# Patient Record
Sex: Female | Born: 1980 | Race: Black or African American | Hispanic: No | Marital: Single | Smoking: Former smoker
Health system: Southern US, Community
[De-identification: ages and names within clinical notes are randomized; demographics above are authoritative.]

## PROBLEM LIST (undated history)

## (undated) ENCOUNTER — Emergency Department (HOSPITAL_BASED_OUTPATIENT_CLINIC_OR_DEPARTMENT_OTHER): Admission: EM | Payer: Self-pay

---

## 2002-05-08 ENCOUNTER — Emergency Department (HOSPITAL_COMMUNITY): Admission: EM | Admit: 2002-05-08 | Discharge: 2002-05-08 | Payer: Self-pay | Admitting: Emergency Medicine

## 2003-11-26 ENCOUNTER — Emergency Department (HOSPITAL_COMMUNITY): Admission: EM | Admit: 2003-11-26 | Discharge: 2003-11-26 | Payer: Self-pay | Admitting: Emergency Medicine

## 2004-10-01 ENCOUNTER — Emergency Department (HOSPITAL_COMMUNITY): Admission: EM | Admit: 2004-10-01 | Discharge: 2004-10-02 | Payer: Self-pay | Admitting: Emergency Medicine

## 2005-05-13 ENCOUNTER — Emergency Department (HOSPITAL_COMMUNITY): Admission: EM | Admit: 2005-05-13 | Discharge: 2005-05-13 | Payer: Self-pay | Admitting: Emergency Medicine

## 2005-06-05 ENCOUNTER — Emergency Department (HOSPITAL_COMMUNITY): Admission: EM | Admit: 2005-06-05 | Discharge: 2005-06-05 | Payer: Self-pay | Admitting: *Deleted

## 2006-02-11 IMAGING — US US PELVIS COMPLETE MODIFY
1 series · 14 of 25 positions shown · non-contrast
Comparison: none

CLINICAL DATA: Vaginal bleeding; hx of abortion
 TRANSABDOMINAL AND TRANSVAGINAL PELVIC ULTRASOUND:
 The uterus measures 8.1 x 3.7 x 4.4 cm.  The myometrium appears normal.  The endometrium is 7 mm thick and normal in appearance.  No sign of retained products of conception.  Right ovary is normal at 2.5 x 1.7 x 2.2 cm.  Left ovary is normal at 2.6 x 1.8 x 2.4 cm.  There is no free fluid.

[Series 1: gyn · 0.23mm/px · 14 of 37 slices shown]
[im 1/37]
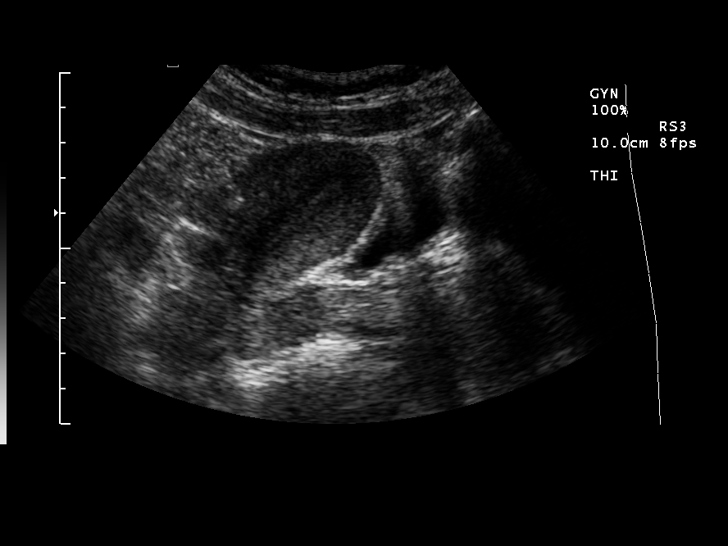
[im 4/37]
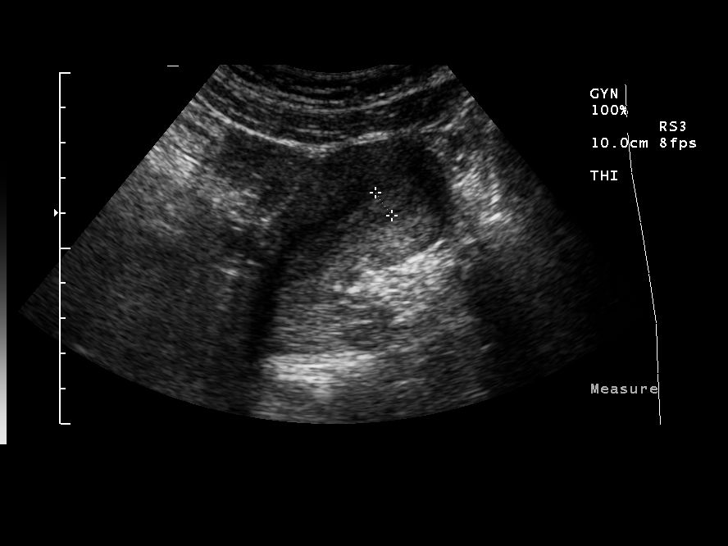
[im 7/37]
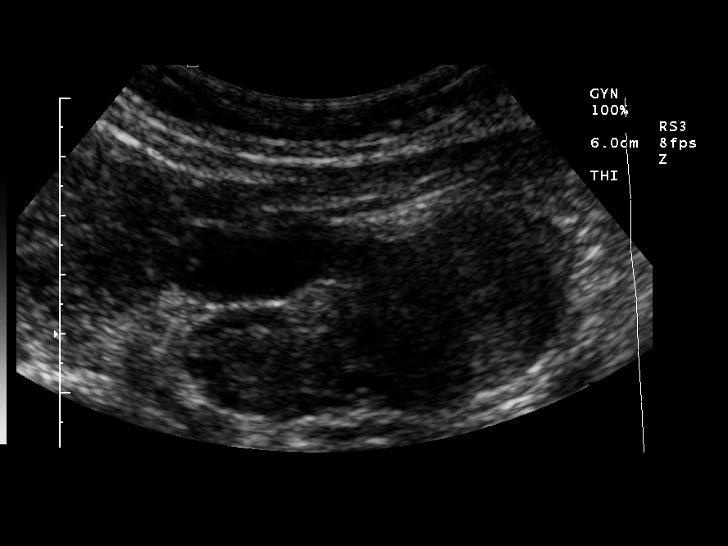
[im 10/37]
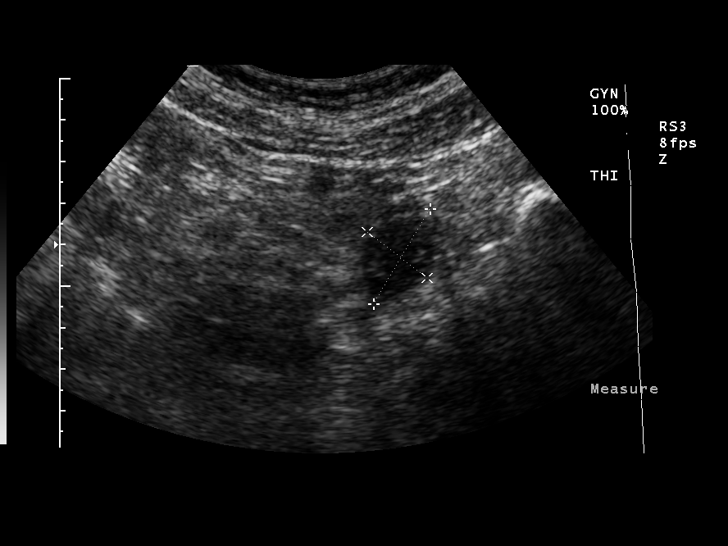
[im 13/37]
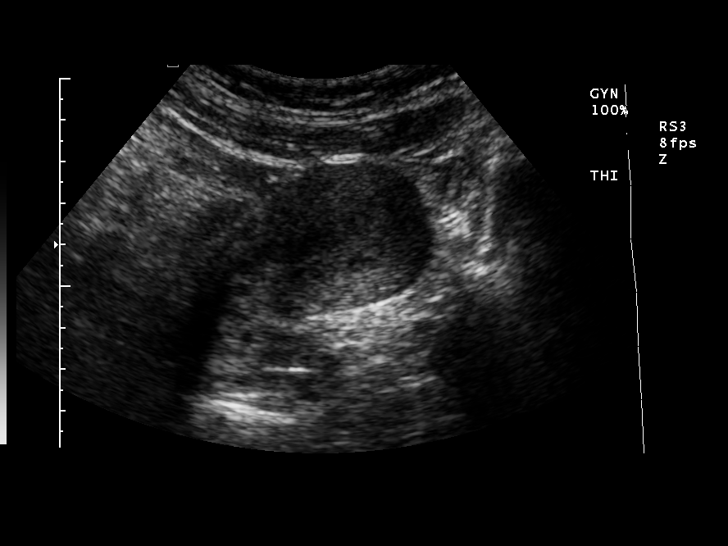
[im 14/37]
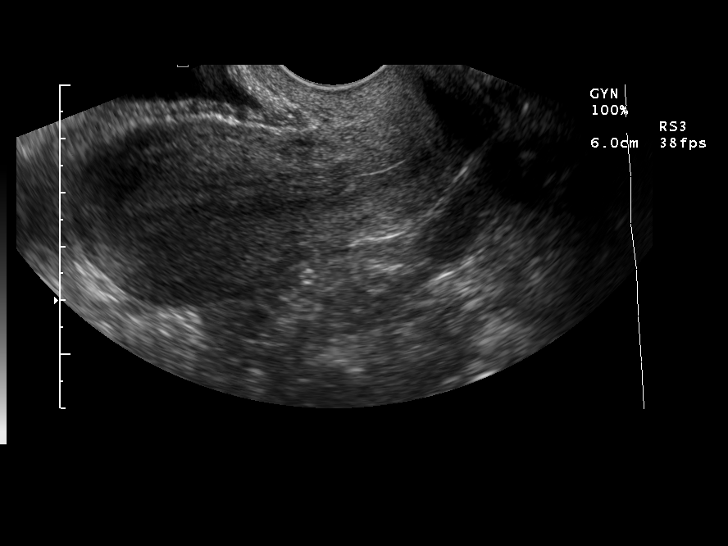
[im 17/37]
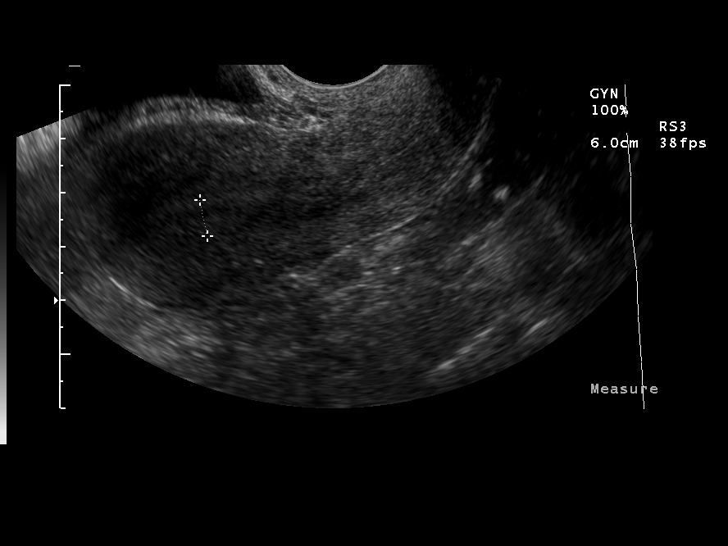
[im 20/37]
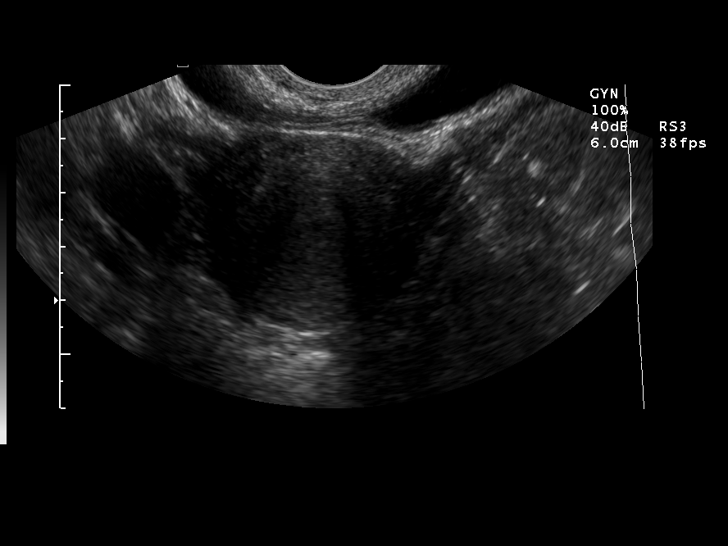
[im 23/37]
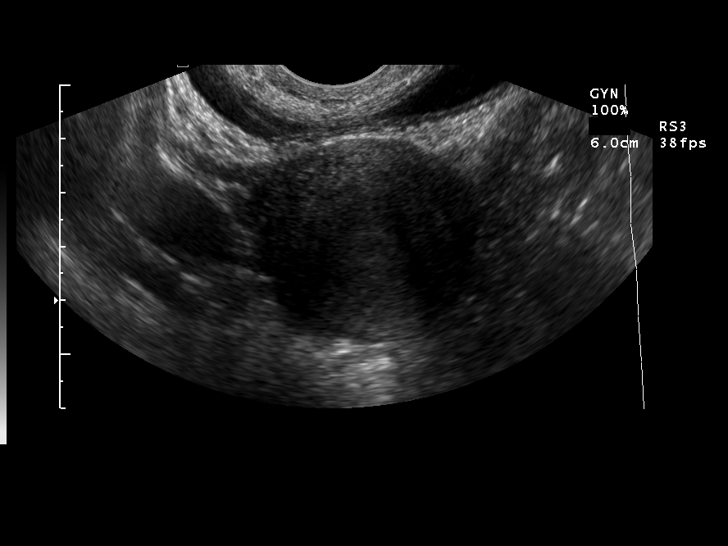
[im 25/37]
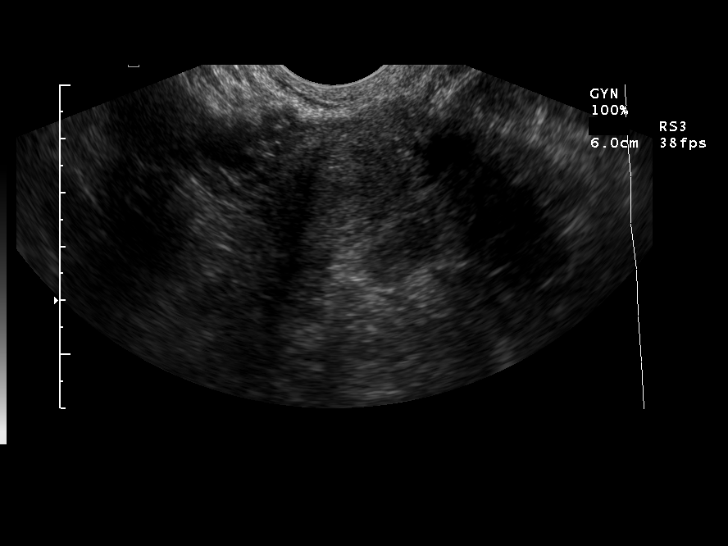
[im 28/37]
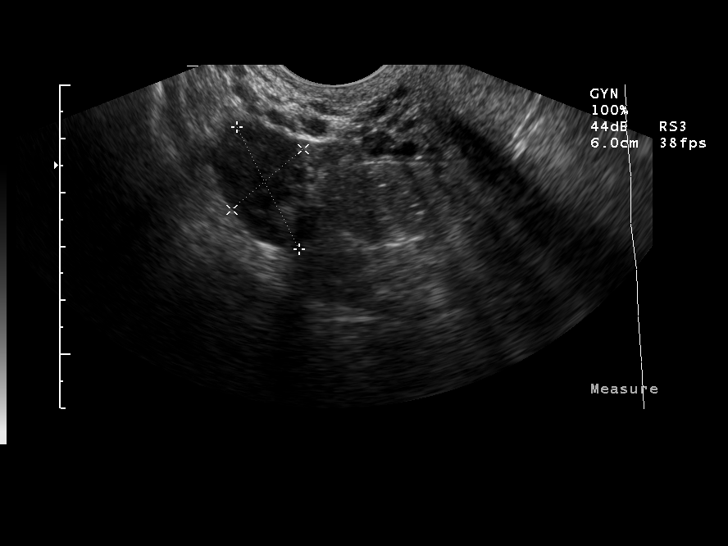
[im 31/37]
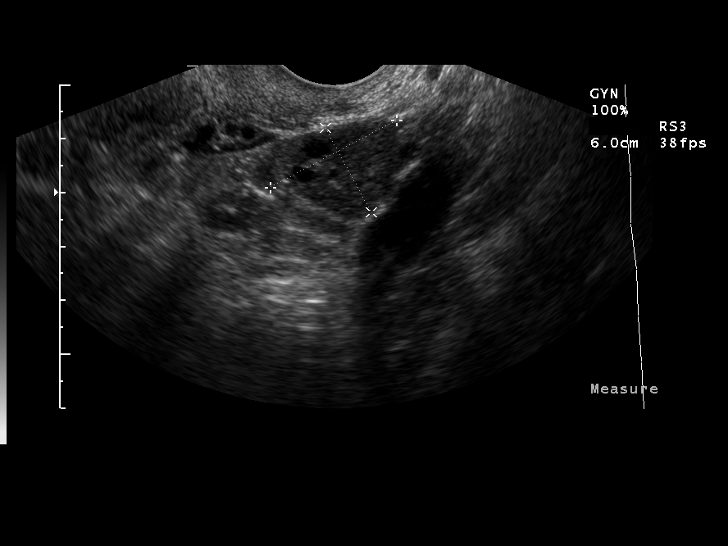
[im 34/37]
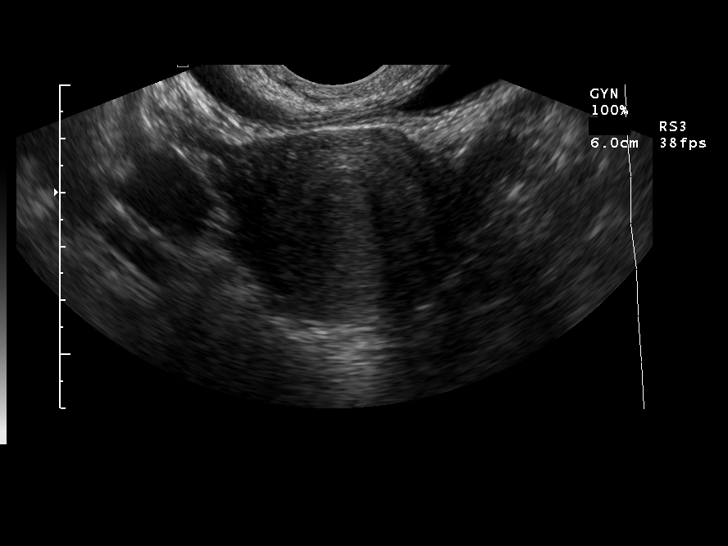
[im 37/37]
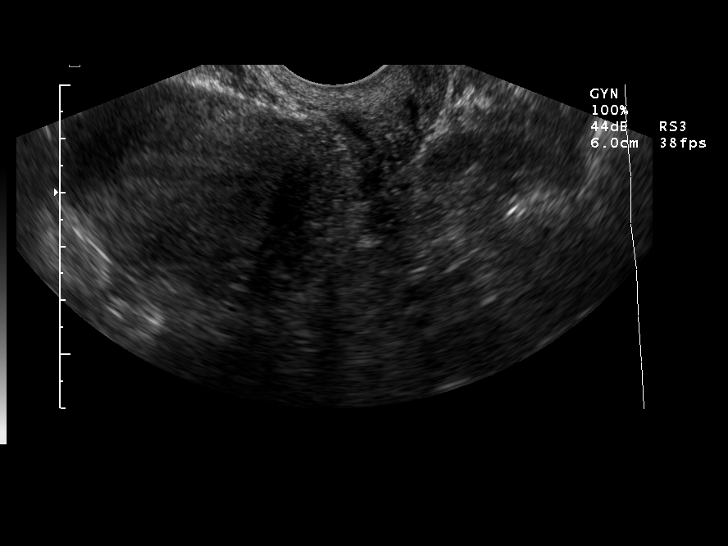

[14 of 25 positions shown; findings below may reference images not displayed]

IMPRESSION: Normal examination.

## 2006-10-16 IMAGING — US US OB COMP +14 WK
1 series · 14 of 28 positions shown · non-contrast
Comparison: none

CLINICAL DATA: 24-year-old pregnant female with abdominal and pelvic pain.

[Series 1: unknown · 0.30mm/px · 14 of 52 slices shown]
[im 2/52]
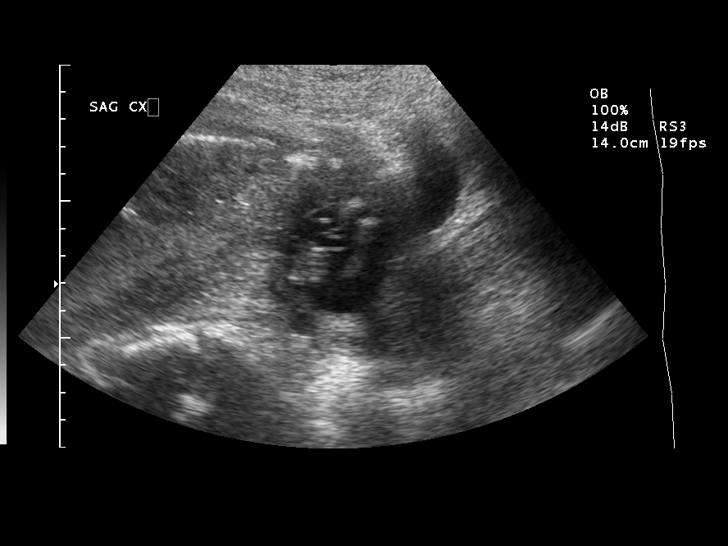
[im 6/52]
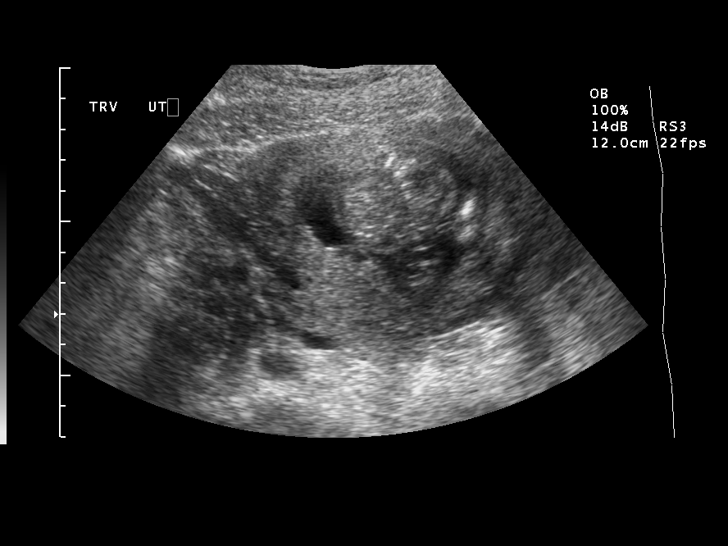
[im 10/52]
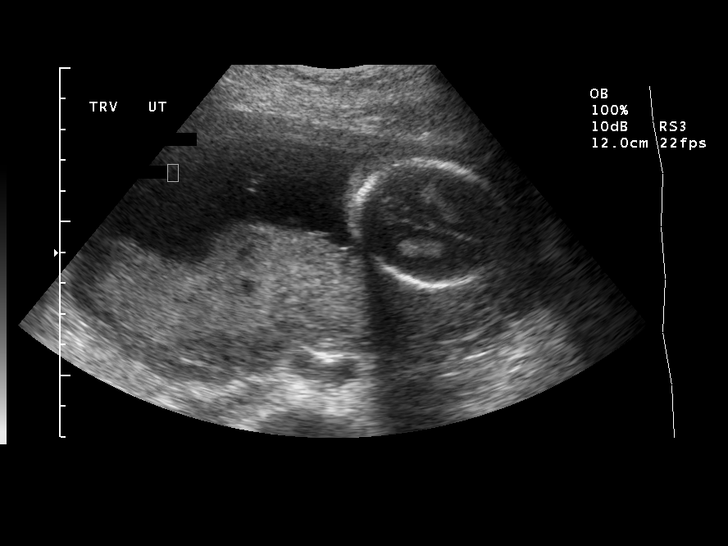
[im 14/52]
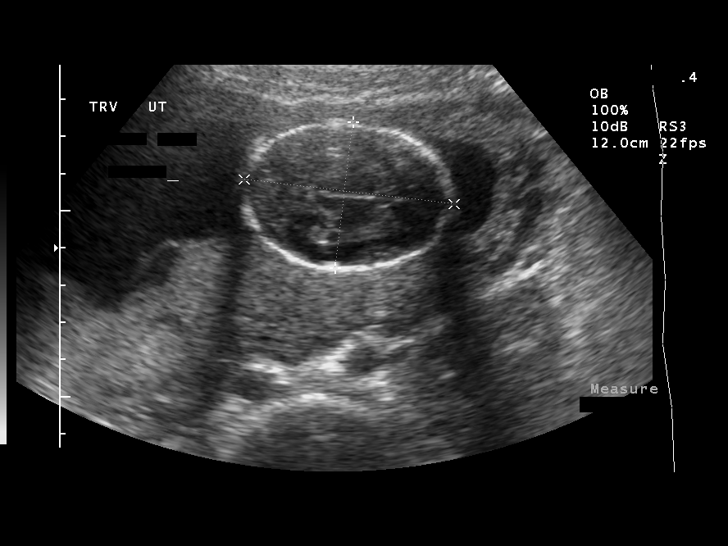
[im 18/52]
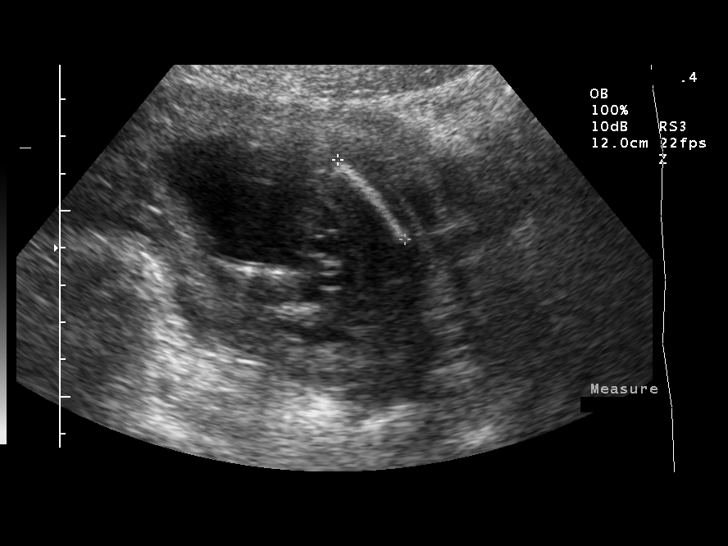
[im 21/52]
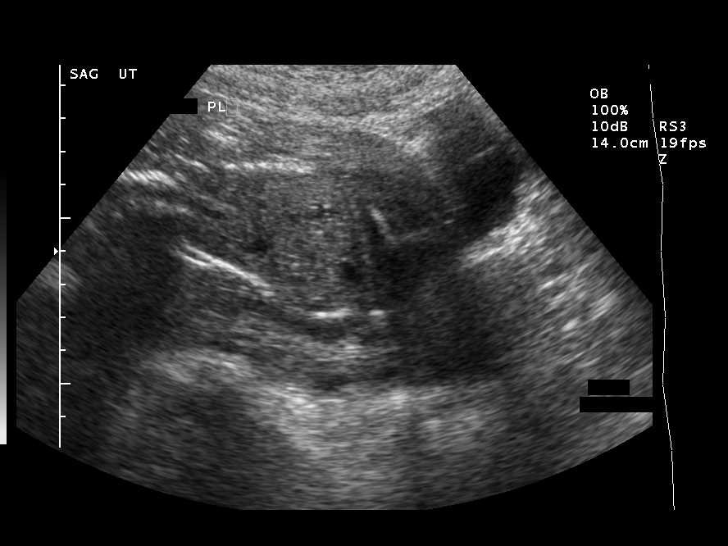
[im 25/52]
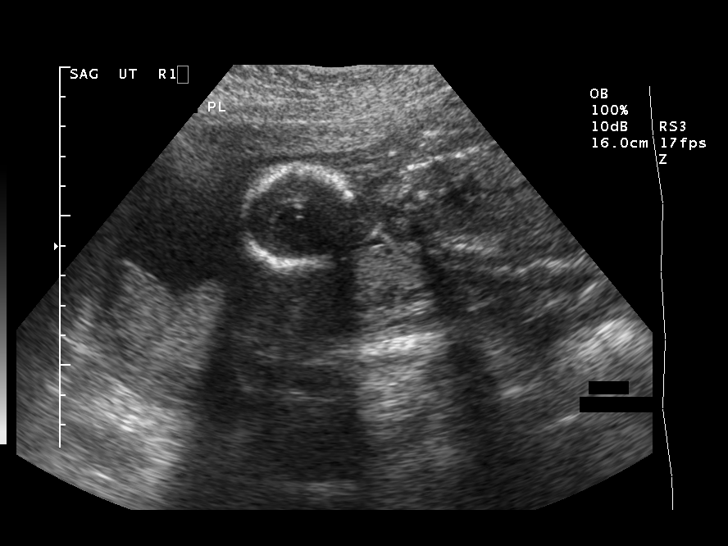
[im 29/52]
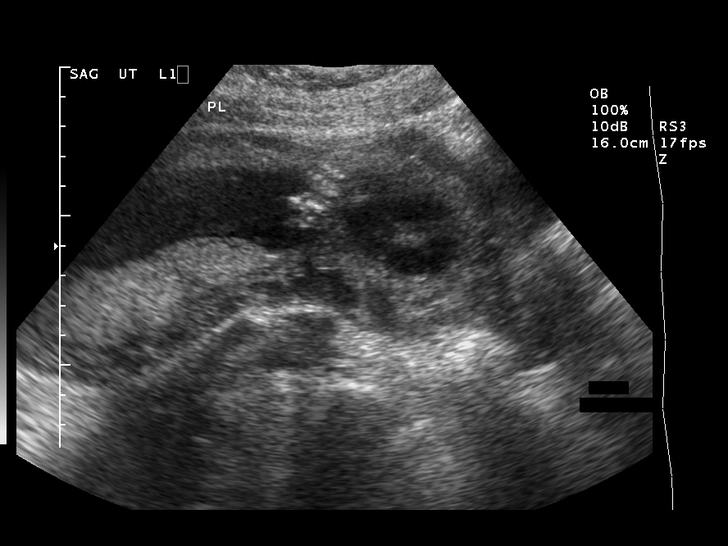
[im 33/52]
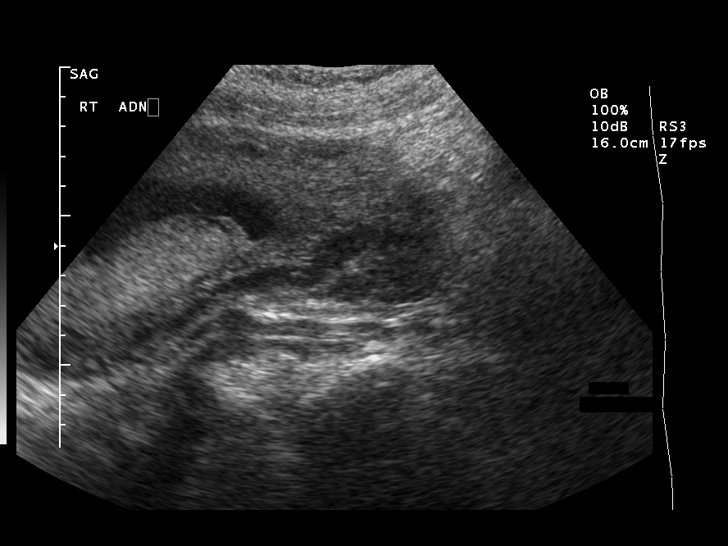
[im 36/52]
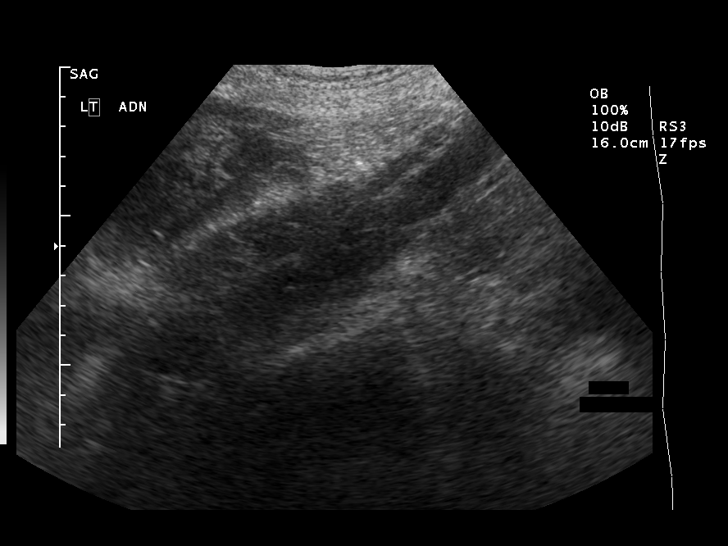
[im 40/52]
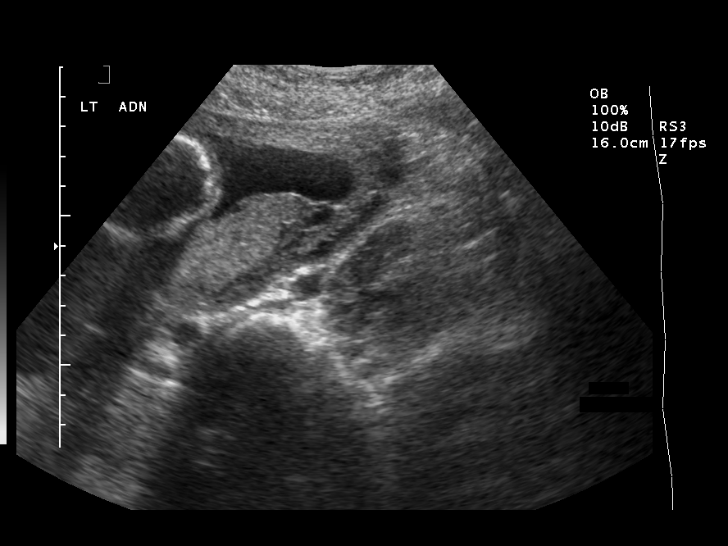
[im 44/52]
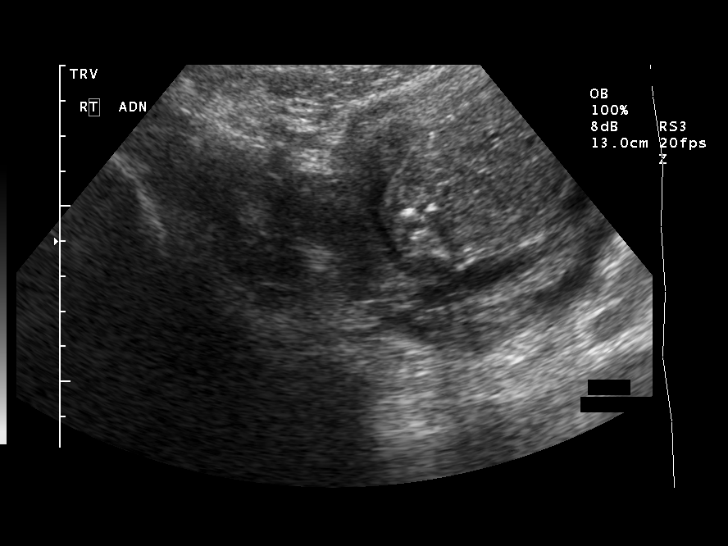
[im 48/52]
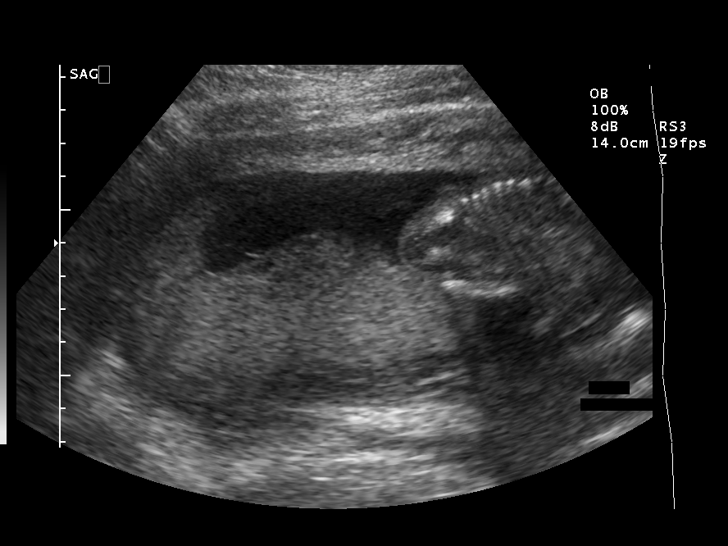
[im 52/52]
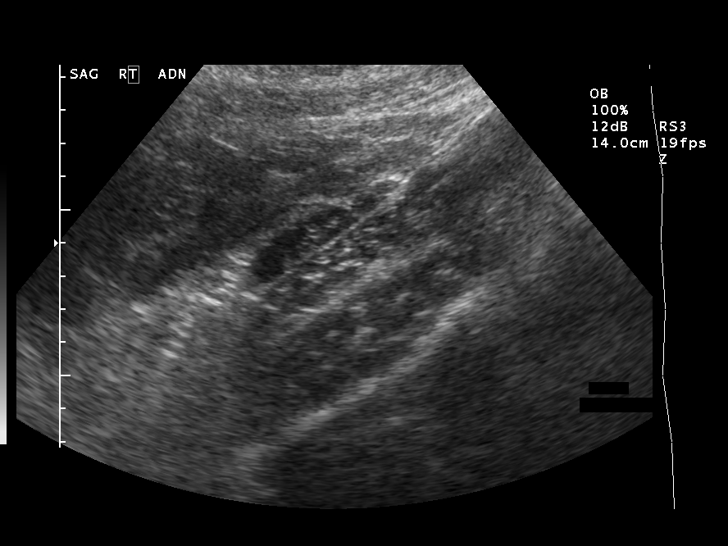

[14 of 28 positions shown; findings below may reference images not displayed]

OBSTETRICAL ULTRASOUND:
 Number of Fetuses:  1
 Heart Rate:  158
 Movement:  Yes
 Breathing:  No
 Presentation:  Breech
 Placental Location:  Posterior
 Grade:  0
 Previa:  No
 Amniotic Fluid (Subjective):  Normal

 FETAL BIOMETRY
 BPD:   4.0 cm  18 w 1 d
 HC:   15.9 cm  18 w 5 d
 AC:   12.4 cm  18 w 1 d
 FL:    2.8 cm  18 w 4 d

 MEAN GA:  18 w 3 d  US EDC:  11/03/05
 Assessment of fetal anatomy was not performed on this exam.
 MATERNAL UTERINE AND ADNEXAL FINDINGS
 Cervix:   Not recorded.
IMPRESSION: Single living intrauterine fetus estimated at 18 weeks and 3 days with normal amniotic fluid volume and normal placenta.

## 2011-07-31 ENCOUNTER — Emergency Department (HOSPITAL_COMMUNITY)
Admission: EM | Admit: 2011-07-31 | Discharge: 2011-07-31 | Disposition: A | Discharge status: Home / Self Care | Payer: Self-pay | Attending: Emergency Medicine | Admitting: Emergency Medicine

## 2011-07-31 ENCOUNTER — Emergency Department (HOSPITAL_COMMUNITY): Payer: Self-pay

## 2011-07-31 DIAGNOSIS — N949 Unspecified condition associated with female genital organs and menstrual cycle: Secondary | ICD-10-CM | POA: Insufficient documentation

## 2011-07-31 DIAGNOSIS — M545 Low back pain, unspecified: Secondary | ICD-10-CM | POA: Insufficient documentation

## 2011-07-31 DIAGNOSIS — N898 Other specified noninflammatory disorders of vagina: Secondary | ICD-10-CM | POA: Insufficient documentation

## 2011-07-31 DIAGNOSIS — N939 Abnormal uterine and vaginal bleeding, unspecified: Secondary | ICD-10-CM

## 2011-07-31 DIAGNOSIS — R109 Unspecified abdominal pain: Secondary | ICD-10-CM | POA: Insufficient documentation

## 2011-07-31 LAB — CBC
MCHC: 32.4 g/dL (ref 30.0–36.0)
RBC: 4.75 MIL/uL (ref 3.87–5.11)

## 2011-07-31 NOTE — ED Notes (Addendum)
Patient states she started spotting on Friday and this morning bleeding worse and cramps. Patient states she has passed clots this morning. Patietn state this was her second pregnancy and she has a 32 years old daughter. Family at bedside.

## 2011-07-31 NOTE — Discharge Instructions (Signed)
Your pregnancy test is negative and ultrasound appears unremarkable. Take tylenol/advil as need. Follow up with your doctor/gynecologist in 1 week if symptoms fail to improve/resolve. Go to [facility] if worse, severe bleeding, severe pain, fevers, other concern.       Dysfunctional Uterine Bleeding Normally, menstrual periods begin between ages 46 to 82 in young women. A normal menstrual cycle/period may begin every 23 days up to 35 days and lasts from 1 to 7 days. Around 12 to 14 days before your menstrual period starts, ovulation (ovary produces an egg) occurs. When counting the time between menstrual periods, count from the first day of bleeding of the previous period to the first day of bleeding of the next period. Dysfunctional (abnormal) uterine bleeding is bleeding that is different from a normal menstrual period. Your periods may come earlier or later than usual. They may be lighter, have blood clots or be heavier. You may have bleeding between periods, or you may skip one period or more. You may have bleeding after sexual intercourse, bleeding after menopause, or no menstrual period. CAUSES   Pregnancy (normal, miscarriage, tubal).   IUDs (intrauterine device, birth control).   Birth control pills.   Hormone treatment.   Menopause.   Infection of the cervix.   Blood clotting problems.   Infection of the inside lining of the uterus.   Endometriosis, inside lining of the uterus growing in the pelvis and other female organs.   Adhesions (scar tissue) inside the uterus.   Obesity or severe weight loss.   Uterine polyps inside the uterus.   Cancer of the vagina, cervix, or uterus.   Ovarian cysts or polycystic ovary syndrome.   Medical problems (diabetes, thyroid disease).   Uterine fibroids (noncancerous tumor).   Problems with your female hormones.   Endometrial hyperplasia, very thick lining and enlarged cells inside of the uterus.   Medicines that  interfere with ovulation.   Radiation to the pelvis or abdomen.   Chemotherapy.  DIAGNOSIS   Your doctor will discuss the history of your menstrual periods, medicines you are taking, changes in your weight, stress in your life, and any medical problems you may have.   Your doctor will do a physical and pelvic examination.   Your doctor may want to perform certain tests to make a diagnosis, such as:   Pap test.   Blood tests.   Cultures for infection.   CT scan.   Ultrasound.   Hysteroscopy.   Laparoscopy.   MRI.   Hysterosalpingography.   D and C.   Endometrial biopsy.  TREATMENT  Treatment will depend on the cause of the dysfunctional uterine bleeding (DUB). Treatment may include:  Observing your menstrual periods for a couple of months.   Prescribing medicines for medical problems, including:   Antibiotics.   Hormones.   Birth control pills.   Removing an IUD (intrauterine device, birth control).   Surgery:   D and C (scrape and remove tissue from inside the uterus).   Laparoscopy (examine inside the abdomen with a lighted tube).   Uterine ablation (destroy lining of the uterus with electrical current, laser, heat, or freezing).   Hysteroscopy (examine cervix and uterus with a lighted tube).   Hysterectomy (remove the uterus).  HOME CARE INSTRUCTIONS   If medicines were prescribed, take exactly as directed. Do not change or switch medicines without consulting your caregiver.   Long term heavy bleeding may result in iron deficiency. Your caregiver may have prescribed iron pills. They  help replace the iron that your body lost from heavy bleeding. Take exactly as directed.   Do not take aspirin or medicines that contain aspirin one week before or during your menstrual period. Aspirin may make the bleeding worse.   If you need to change your sanitary pad or tampon more than once every 2 hours, stay in bed with your feet elevated and a cold pack on  your lower abdomen. Rest as much as possible, until the bleeding stops or slows down.   Eat well-balanced meals. Eat foods high in iron. Examples are:   Leafy green vegetables.   Whole-grain breads and cereals.   Eggs.   Meat.   Liver.   Do not try to lose weight until the abnormal bleeding has stopped and your blood iron level is back to normal. Do not lift more than ten pounds or do strenuous activities when you are bleeding.   For a couple of months, make note on your calendar, marking the start and ending of your period, and the type of bleeding (light, medium, heavy, spotting, clots or missed periods). This is for your caregiver to better evaluate your problem.  SEEK MEDICAL CARE IF:   You develop nausea (feeling sick to your stomach) and vomiting, dizziness, or diarrhea while you are taking your medicine.   You are getting lightheaded or weak.   You have any problems that may be related to the medicine you are taking.   You develop pain with your DUB.   You want to remove your IUD.   You want to stop or change your birth control pills or hormones.   You have any type of abnormal bleeding mentioned above.   You are over 17 years old and have not had a menstrual period yet.   You are 30 years old and you are still having menstrual periods.   You have any of the symptoms mentioned above.   You develop a rash.  SEEK IMMEDIATE MEDICAL CARE IF:   An oral temperature above 102 F (38.9 C) develops.   You develop chills.   You are changing your sanitary pad or tampon more than once an hour.   You develop abdominal pain.   You pass out or faint.  Document Released: 04/21/2000 Document Revised: 04/13/2011 Document Reviewed: 03/23/2009 [facility] Patient Information 2012 [place], [place].

## 2011-07-31 NOTE — ED Notes (Signed)
Patient being transported to [place] and will be transfer to CDU 3 on return to ED. Report was given to [place], Charity fundraiser.

## 2011-07-31 NOTE — ED Notes (Signed)
Patient resting with husband at bedside. Patient crying and is visible upset. Waiting test results.

## 2011-07-31 NOTE — ED Provider Notes (Signed)
History     CSN: [id number]  Arrival date & time 07/31/11  [id number]   First MD Initiated Contact with Patient 07/31/11 [phone]      Chief Complaint  Patient presents with  . Miscarriage    (Consider location/radiation/quality/duration/timing/severity/associated sxs/prior treatment) The history is provided by the patient.  pt states possible miscarriage. lnmp end of January 2013. In past 4-5 days has had some 'spotting', dark blood. In past 1-2 days, heavier vaginal bleeding c/w period, dark blood, clots. Intermittent lower abd and low back cramping c/w menstrual cramps. No constant, focal abdominal pain. No vaginal discharge. No dysuria or urgency.  No fever or chills. No faintness or dizziness.  [id number], has 32 year old and 1 prior miscarriage.   No past medical history on file.  No past surgical history on file.  No family history on file.  History  Substance Use Topics  . Smoking status: Not on file  . Smokeless tobacco: Not on file  . Alcohol Use: Not on file    OB History    No data available      Review of Systems  Constitutional: Negative for fever and chills.  HENT: Negative for neck pain.   Respiratory: Negative for shortness of breath.   Cardiovascular: Negative for chest pain.  Gastrointestinal: Negative for abdominal pain.  Genitourinary: Negative for flank pain.  Musculoskeletal: Negative for back pain.  Skin: Negative for rash.  Neurological: Negative for headaches.  Hematological: Does not bruise/bleed easily.  Psychiatric/Behavioral: Negative for confusion.    Allergies  Aspirin  Home Medications   Current Outpatient Rx  Name Route Sig Dispense Refill  . ACETAMINOPHEN 500 MG PO TABS Oral Take 1,000 mg by mouth once.      BP 128/100  Pulse 83  Temp(Src) 98.2 F (36.8 C) (Oral)  SpO2 98%  Physical Exam  Nursing note and vitals reviewed. Constitutional: She appears well-developed and well-nourished. No distress.  Eyes: Conjunctivae are normal. No  scleral icterus.  Neck: Neck supple. No tracheal deviation present.  Cardiovascular: Normal rate, regular rhythm, normal heart sounds and intact distal pulses.   Pulmonary/Chest: Effort normal and breath sounds normal. No respiratory distress.  Abdominal: Soft. Normal appearance and bowel sounds are normal. She exhibits no distension. There is no tenderness.  Genitourinary:       No cva tenderness. Pelvic, ext exam normal. Pt w small amt dark blood in vagina. No active bleeding from cervix. Cervix closed. No cmt. No adx masses/ tenderness.   Musculoskeletal: She exhibits no edema.  Neurological: She is alert.  Skin: Skin is warm and dry. No rash noted.  Psychiatric: She has a normal mood and affect.    ED Course  Procedures (including critical care time)   Results for orders placed during the hospital encounter of 07/31/11  CBC      Component Value Range   WBC 6.4  4.0 - 10.5 (K/uL)   RBC 4.75  3.87 - 5.11 (MIL/uL)   Hemoglobin 10.8 (*) 12.0 - 15.0 (g/dL)   HCT 29.5 (*) 28.4 - 46.0 (%)   MCV 70.1 (*) 78.0 - 100.0 (fL)   MCH 22.7 (*) 26.0 - 34.0 (pg)   MCHC 32.4  30.0 - 36.0 (g/dL)   RDW 13.2  44.0 - 10.2 (%)   Platelets 293  150 - 400 (K/uL)  HCG, QUANTITATIVE, PREGNANCY      Component Value Range   hCG, Beta Chain, Quant, S <1  <5 (mIU/mL)  ABO/RH  Component Value Range   ABO/RH(D) O POS     No rh immune globuloin NOT A RH IMMUNE GLOBULIN CANDIDATE, PT RH POSITIVE     US Transvaginal Non-ob  07/31/2011  *RADIOLOGY REPORT*  Clinical Data: Pelvic pain and vaginal bleeding.  TRANSABDOMINAL AND TRANSVAGINAL ULTRASOUND OF PELVIS Technique:  Both transabdominal and transvaginal ultrasound examinations of the pelvis were performed. Transabdominal technique was performed for global imaging of the pelvis including uterus, ovaries, adnexal regions, and pelvic cul-de-sac.  Comparison: None.   It was necessary to proceed with endovaginal exam following the transabdominal exam to  visualize the ovaries and endometrium.  Findings:  Uterus: Measures 7.7 x 5.4 x 4.4 cm.  Retroverted position.  No myometrial abnormalities are demonstrated.  Endometrium: Normal in thickness measuring 2 mm.  Right ovary:  Measures 2.5 x 1.6 x 1.6 cm.  No cysts or masses.  Left ovary: Measures 2.6 x 2.4 x 2.0 cm.  No cysts or masses.  Other findings: Trace free pelvic fluid.  IMPRESSION: Normal study. No evidence of pelvic mass or other significant abnormality.  Original Report Authenticated By: P. Loralie Champagne, M.D.   US Pelvis Complete  07/31/2011  *RADIOLOGY REPORT*  Clinical Data: Pelvic pain and vaginal bleeding.  TRANSABDOMINAL AND TRANSVAGINAL ULTRASOUND OF PELVIS Technique:  Both transabdominal and transvaginal ultrasound examinations of the pelvis were performed. Transabdominal technique was performed for global imaging of the pelvis including uterus, ovaries, adnexal regions, and pelvic cul-de-sac.  Comparison: None.   It was necessary to proceed with endovaginal exam following the transabdominal exam to visualize the ovaries and endometrium.  Findings:  Uterus: Measures 7.7 x 5.4 x 4.4 cm.  Retroverted position.  No myometrial abnormalities are demonstrated.  Endometrium: Normal in thickness measuring 2 mm.  Right ovary:  Measures 2.5 x 1.6 x 1.6 cm.  No cysts or masses.  Left ovary: Measures 2.6 x 2.4 x 2.0 cm.  No cysts or masses.  Other findings: Trace free pelvic fluid.  IMPRESSION: Normal study. No evidence of pelvic mass or other significant abnormality.  Original Report Authenticated By: P. Loralie Champagne, M.D.        MDM  Labs and ultrasound ordered.   Recheck abd soft nt.    U/s neg. preg neg. abd soft nt.       Suzi Roots, MD 07/31/11 1135

## 2011-07-31 NOTE — ED Notes (Signed)
Miscarriage

## 2012-12-10 IMAGING — US US PELVIS COMPLETE
1 series · 14 of 25 positions shown · non-contrast
Comparison: None.

CLINICAL DATA: Pelvic pain and vaginal bleeding.

TRANSABDOMINAL AND TRANSVAGINAL ULTRASOUND OF PELVIS
TECHNIQUE: Both transabdominal and transvaginal ultrasound
examinations of the pelvis were performed. Transabdominal technique
was performed for global imaging of the pelvis including uterus,
ovaries, adnexal regions, and pelvic cul-de-sac.

[Series 1: us pelvis complete · 0.28mm/px · 14 of 52 slices shown]
[im 1/52]
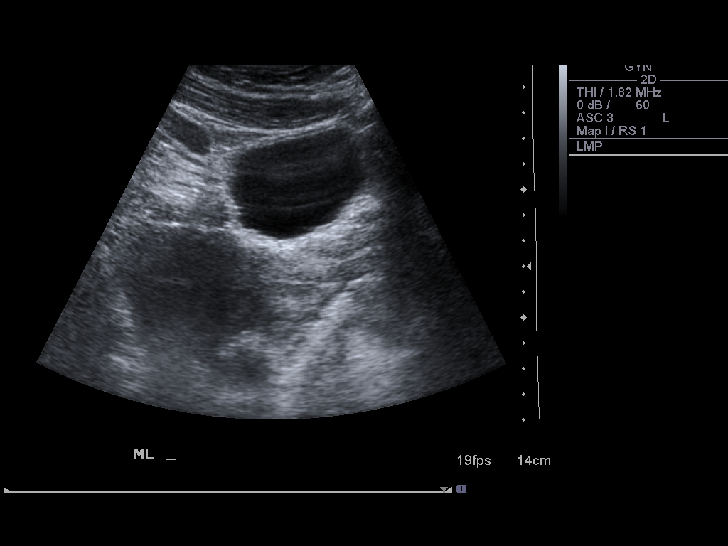
[im 5/52]
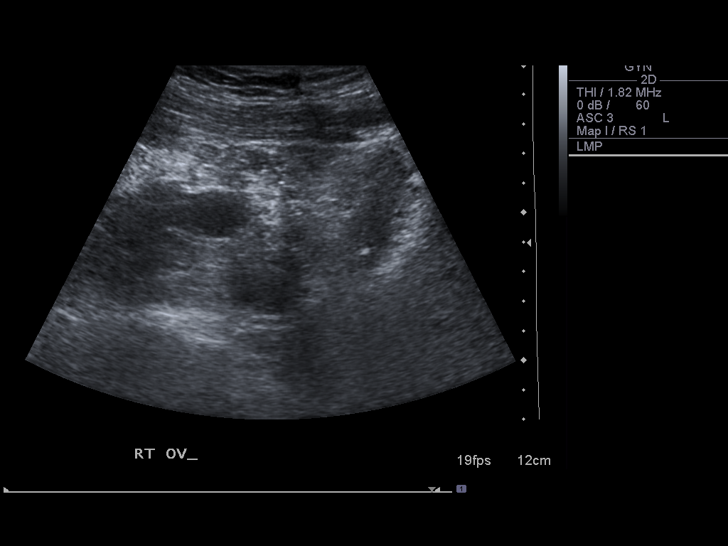
[im 9/52]
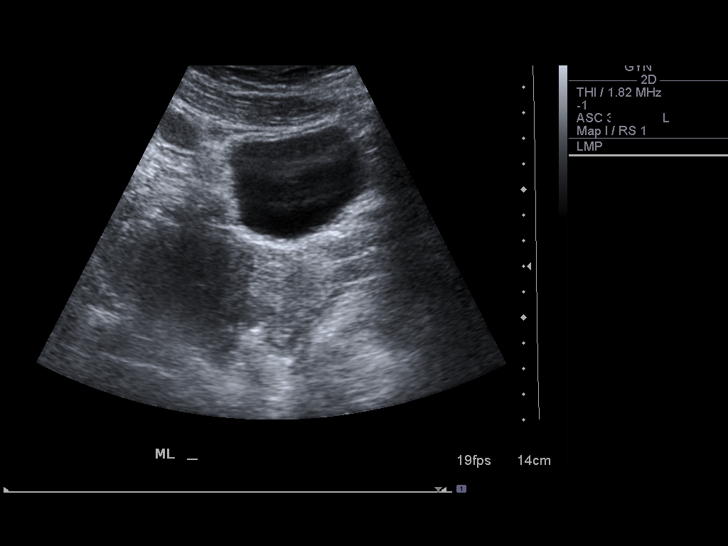
[im 13/52]
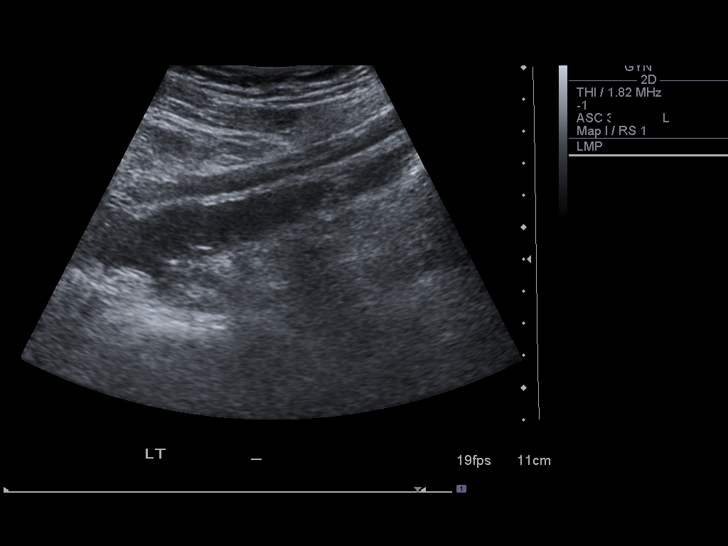
[im 18/52]
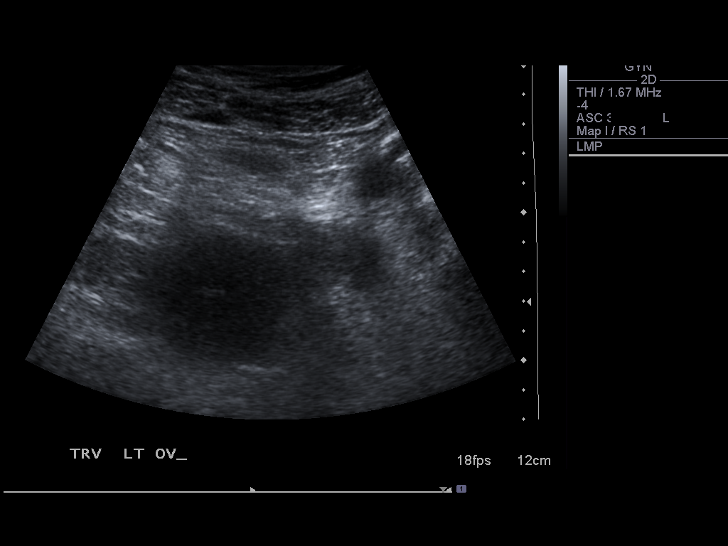
[im 20/52]
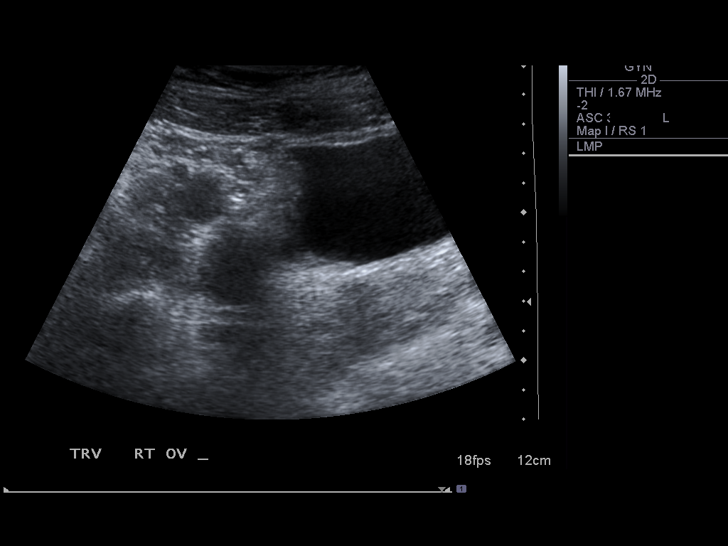
[im 24/52]
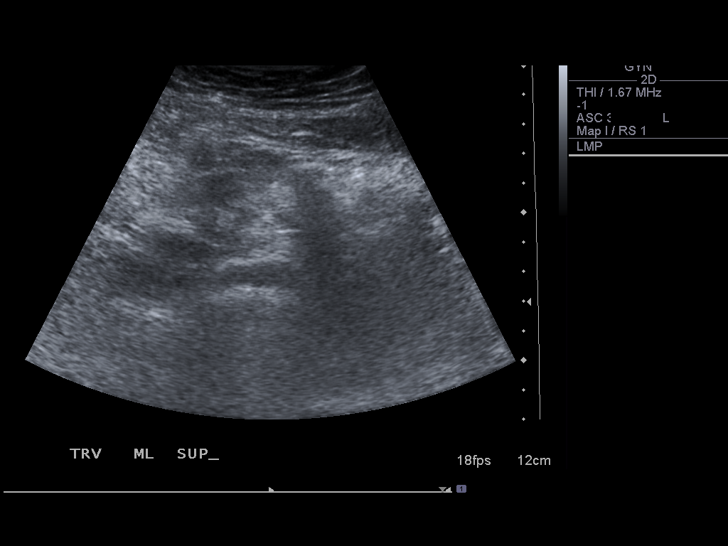
[im 28/52]
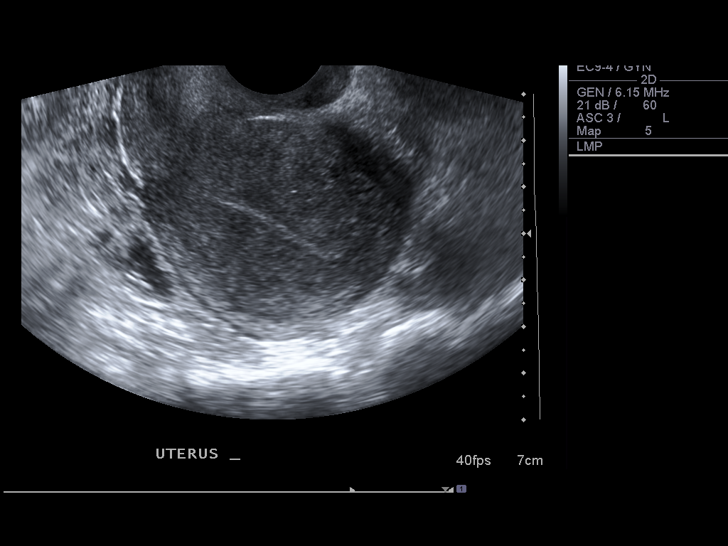
[im 32/52]
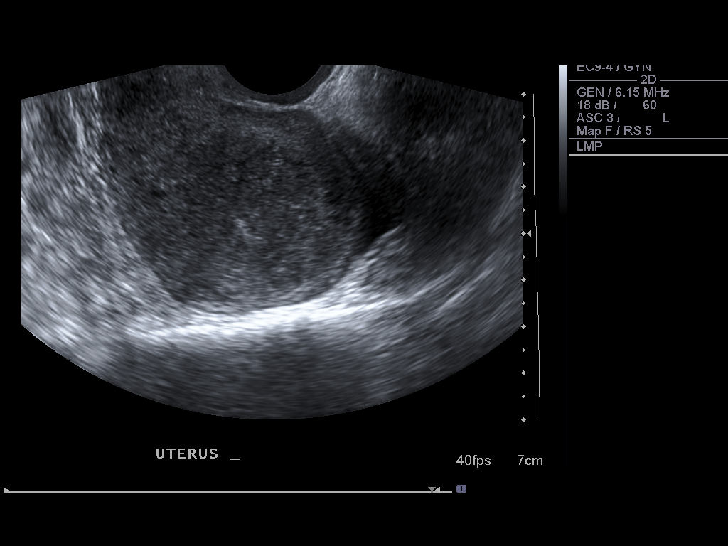
[im 35/52]
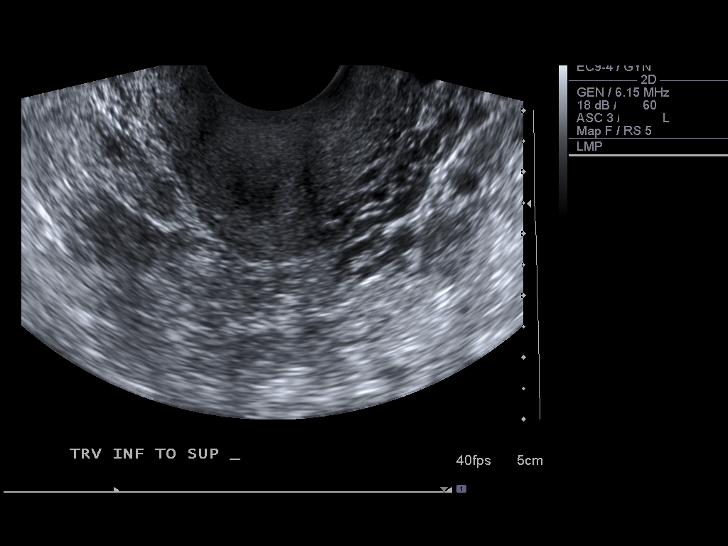
[im 39/52]
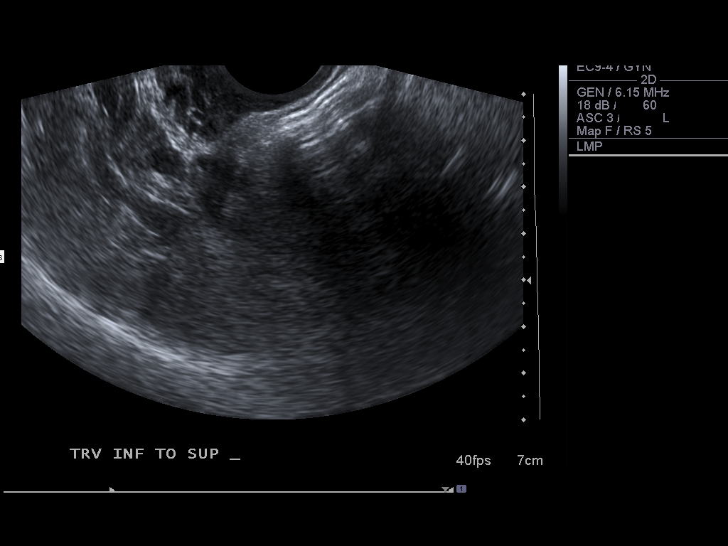
[im 43/52]
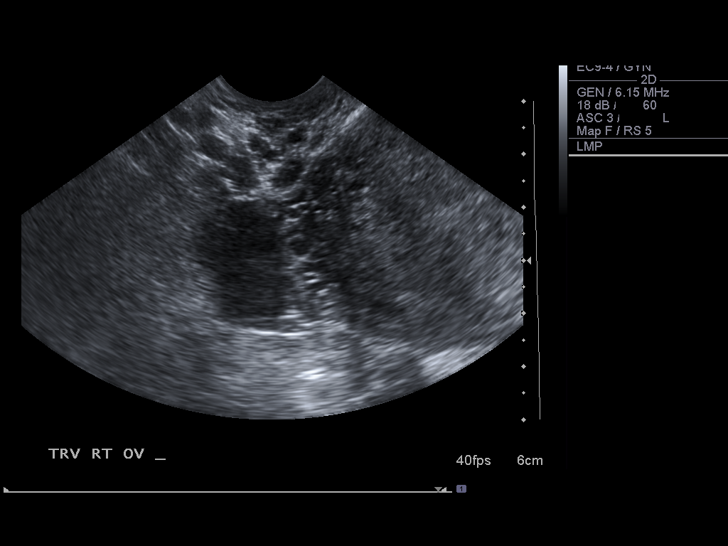
[im 47/52]
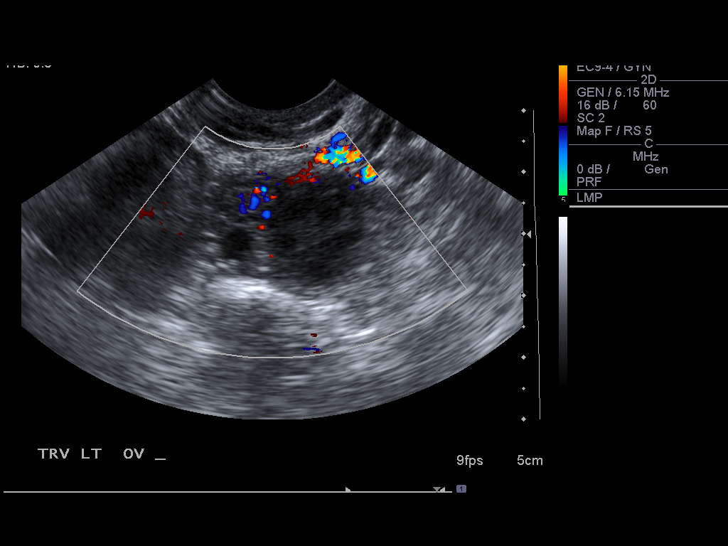
[im 52/52]
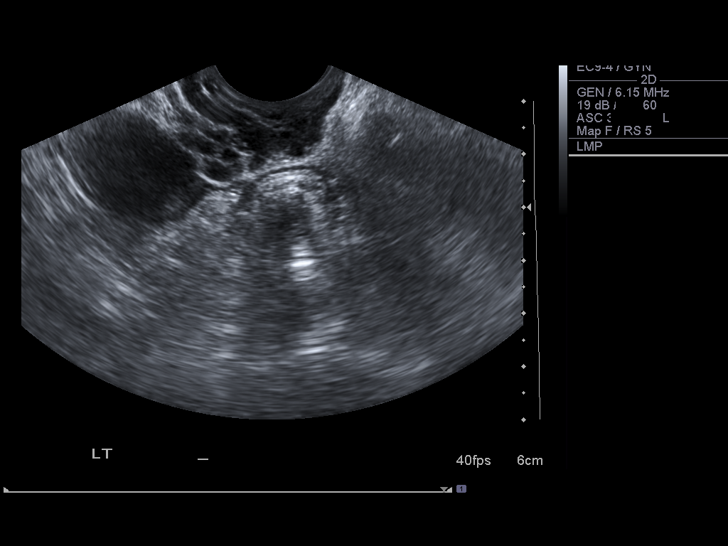

[14 of 25 positions shown; findings below may reference images not displayed]

It was necessary to proceed with endovaginal exam following the
transabdominal exam to visualize the ovaries and endometrium..
FINDINGS: Uterus: Measures 7.7 x 5.4 x 4.4 cm.  Retroverted position.  No
myometrial abnormalities are demonstrated.

Endometrium: Normal in thickness measuring 2 mm.

Right ovary:  Measures 2.5 x 1.6 x 1.6 cm.  No cysts or masses.

Left ovary: Measures 2.6 x 2.4 x 2.0 cm.  No cysts or masses.

Other findings: Trace free pelvic fluid.
IMPRESSION: Normal study. No evidence of pelvic mass or other significant
abnormality.

## 2015-06-15 ENCOUNTER — Encounter: Payer: Self-pay | Admitting: Podiatry

## 2015-06-15 ENCOUNTER — Inpatient Hospital Stay: Admission: RE | Admit: 2015-06-15 | Payer: Self-pay | Source: Ambulatory Visit

## 2015-06-15 ENCOUNTER — Ambulatory Visit: Payer: Self-pay | Admitting: Podiatry

## 2015-06-15 ENCOUNTER — Other Ambulatory Visit: Payer: Self-pay

## 2015-06-15 ENCOUNTER — Ambulatory Visit (INDEPENDENT_AMBULATORY_CARE_PROVIDER_SITE_OTHER): Payer: Medicaid Other | Admitting: Podiatry

## 2015-06-15 VITALS — BP 118/84 | HR 86 | Ht 70.0 in | Wt 243.0 lb

## 2015-06-15 DIAGNOSIS — M21969 Unspecified acquired deformity of unspecified lower leg: Secondary | ICD-10-CM | POA: Diagnosis not present

## 2015-06-15 DIAGNOSIS — M216X9 Other acquired deformities of unspecified foot: Secondary | ICD-10-CM | POA: Diagnosis not present

## 2015-06-15 DIAGNOSIS — M216X2 Other acquired deformities of left foot: Secondary | ICD-10-CM

## 2015-06-15 DIAGNOSIS — M216X1 Other acquired deformities of right foot: Secondary | ICD-10-CM | POA: Insufficient documentation

## 2015-06-15 DIAGNOSIS — M659 Synovitis and tenosynovitis, unspecified: Secondary | ICD-10-CM | POA: Insufficient documentation

## 2015-06-15 NOTE — Patient Instructions (Signed)
Seen for pain in left instep area. Noted of unstable and short first metatarsal bone on both feet. Ankle brace placed in left foot.  Need Custom or OTC orthotics. Return in one month.

## 2015-06-15 NOTE — Progress Notes (Signed)
SUBJECTIVE: 35 y.o. year old female presents complaining of pain at left instep near TNJ x 2 and 1/2 months. Her lower legs get cramping and hurting as well.  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 Baby is 4 months old and still breast fed. This is first episode. 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  REVIEW OF SYSTEMS: Constitutional: negative Eyes: negative Ears, nose, mouth, throat, and face: negative Respiratory: negative Cardiovascular: negative Gastrointestinal: negative Genitourinary:negative Integument/breast: negative Musculoskeletal:Pain in left foot. Neurological: negative Endocrine: negative Allergic/Immunologic: negative  OBJECTIVE: DERMATOLOGIC EXAMINATION: Nails: normal appearing nails bilaterally No abnormal skin lesions noted. VASCULAR EXAMINATION OF LOWER LIMBS: Pedal pulses: All pedal pulses are palpable with normal pulsation.  Capillary Filling times within 3 seconds in all digits.  No edema or erythema noted. Temperature gradient from tibial crest to dorsum of foot is within normal bilateral.  NEUROLOGIC EXAMINATION OF THE LOWER LIMBS: All epicritic and tactile sensations grossly intact. Sharp and Dull discriminatory sensations at the plantar ball of hallux is intact bilateral.   MUSCULOSKELETAL EXAMINATION: Positive for hypermobile first ray bilateral. Pain at the Talonavicular joint at medial aspect. Forefoot varus bilateral. STJ excess pronation bilateral.  RADIOGRAPHIC STUDIES:  General overview: Negative of Osteopenia Absence of soft tissue swelling. Absence of acute osseous or articular surfaces of forefoot or rearfoot.   AP View:  Increased Hallux valgus angle, Fibular sesamoid position at 6, short first metatarsal (-4), increased lateral deviation angle of CCJ, contracted varus rotated 3-5 digits bilateral.   Lateral view:  Pronated  foot with midtarsal sagging  Low calcaneal inclination angle, anterior break in CYMA line, elevated first ray noted. No spurs are present bilateral.  Lesion marker at the painful site correspond with TNJ at medial aspect of the left foot.   ASSESSMENT: Tenosynovitis left TNJ. STJ hyperpronation bilateral. Metatarsal deformity (short -4).  PLAN: Reviewed clinical findings and available treatment options, NSAIA, ankle brace, orthotics, surgical options. Left foot placed in ankle brace. Return in one month.

## 2015-07-14 ENCOUNTER — Encounter: Payer: Self-pay | Admitting: Podiatry

## 2015-07-14 ENCOUNTER — Ambulatory Visit (INDEPENDENT_AMBULATORY_CARE_PROVIDER_SITE_OTHER): Payer: Medicaid Other | Admitting: Podiatry

## 2015-07-14 VITALS — BP 118/81 | HR 71

## 2015-07-14 DIAGNOSIS — M659 Synovitis and tenosynovitis, unspecified: Secondary | ICD-10-CM

## 2015-07-14 DIAGNOSIS — M216X1 Other acquired deformities of right foot: Secondary | ICD-10-CM

## 2015-07-14 DIAGNOSIS — M216X9 Other acquired deformities of unspecified foot: Secondary | ICD-10-CM

## 2015-07-14 DIAGNOSIS — M216X2 Other acquired deformities of left foot: Secondary | ICD-10-CM

## 2015-07-14 DIAGNOSIS — M21969 Unspecified acquired deformity of unspecified lower leg: Secondary | ICD-10-CM | POA: Diagnosis not present

## 2015-07-14 NOTE — Progress Notes (Signed)
Subjective: 35 year old female presents follow up on left arch pain.  Foot still hurt with ankle brace.  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  Still having burning sensation at instep area. Pain with ambulation. While resting still hurts at times.  Wears [employer] inserts, which did not help. Patient does not work at this time. Still having severe hypermobility of the first ray left. No edema or erythema noted on left foot. Findings have no change since the last visit.   ASSESSMENT: Tenosynovitis left TNJ. STJ hyperpronation bilateral. Metatarsal deformity (short -4).  PLAN: Reviewed clinical findings and available treatment options, NSAIA, ankle brace, orthotics, surgical options ([place]). Continue with ankle brace. All questions answered. Return as needed.

## 2015-07-14 NOTE — Patient Instructions (Signed)
Follow up on left foot pain. Still having pain with OTC inserts and ankle brace. Continue with ankle brace. May benefit from custom orthotics, possible surgical intervention. Return as needed.
